# Patient Record
Sex: Male | Born: 1995 | Race: White | Hispanic: No | Marital: Single | Smoking: Never smoker
Health system: Southern US, Community
[De-identification: ages and names within clinical notes are randomized; demographics above are authoritative.]

## PROBLEM LIST (undated history)

## (undated) DIAGNOSIS — Q873 Congenital malformation syndromes involving early overgrowth: Secondary | ICD-10-CM

## (undated) DIAGNOSIS — K449 Diaphragmatic hernia without obstruction or gangrene: Secondary | ICD-10-CM

## (undated) DIAGNOSIS — J45909 Unspecified asthma, uncomplicated: Secondary | ICD-10-CM

## (undated) HISTORY — DX: Diaphragmatic hernia without obstruction or gangrene: K44.9

## (undated) HISTORY — DX: Congenital malformation syndromes involving early overgrowth: Q87.3

## (undated) HISTORY — PX: GASTROSTOMY W/ FEEDING TUBE: SUR642

## (undated) HISTORY — PX: TONSILLECTOMY: SUR1361

## (undated) HISTORY — PX: ORCHIOPEXY: SHX479

## (undated) HISTORY — PX: TYMPANOSTOMY TUBE PLACEMENT: SHX32

## (undated) HISTORY — DX: Unspecified asthma, uncomplicated: J45.909

---
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
## 2017-11-29 DIAGNOSIS — K529 Noninfective gastroenteritis and colitis, unspecified: Secondary | ICD-10-CM | POA: Diagnosis not present

## 2018-01-15 DIAGNOSIS — Z0000 Encounter for general adult medical examination without abnormal findings: Secondary | ICD-10-CM | POA: Diagnosis not present

## 2018-01-15 DIAGNOSIS — K5904 Chronic idiopathic constipation: Secondary | ICD-10-CM | POA: Diagnosis not present

## 2018-01-15 DIAGNOSIS — K219 Gastro-esophageal reflux disease without esophagitis: Secondary | ICD-10-CM | POA: Diagnosis not present

## 2018-01-27 DIAGNOSIS — S93402A Sprain of unspecified ligament of left ankle, initial encounter: Secondary | ICD-10-CM | POA: Diagnosis not present

## 2018-01-29 DIAGNOSIS — S8265XA Nondisplaced fracture of lateral malleolus of left fibula, initial encounter for closed fracture: Secondary | ICD-10-CM | POA: Diagnosis not present

## 2018-02-05 DIAGNOSIS — S8265XD Nondisplaced fracture of lateral malleolus of left fibula, subsequent encounter for closed fracture with routine healing: Secondary | ICD-10-CM | POA: Diagnosis not present

## 2018-02-14 DIAGNOSIS — S8265XD Nondisplaced fracture of lateral malleolus of left fibula, subsequent encounter for closed fracture with routine healing: Secondary | ICD-10-CM | POA: Diagnosis not present

## 2018-02-26 DIAGNOSIS — J209 Acute bronchitis, unspecified: Secondary | ICD-10-CM | POA: Diagnosis not present

## 2018-02-26 DIAGNOSIS — J069 Acute upper respiratory infection, unspecified: Secondary | ICD-10-CM | POA: Diagnosis not present

## 2018-03-12 DIAGNOSIS — S8265XD Nondisplaced fracture of lateral malleolus of left fibula, subsequent encounter for closed fracture with routine healing: Secondary | ICD-10-CM | POA: Diagnosis not present

## 2018-03-29 DIAGNOSIS — M25572 Pain in left ankle and joints of left foot: Secondary | ICD-10-CM | POA: Diagnosis not present

## 2018-03-29 DIAGNOSIS — M6281 Muscle weakness (generalized): Secondary | ICD-10-CM | POA: Diagnosis not present

## 2018-03-29 DIAGNOSIS — R2689 Other abnormalities of gait and mobility: Secondary | ICD-10-CM | POA: Diagnosis not present

## 2018-04-06 DIAGNOSIS — M6281 Muscle weakness (generalized): Secondary | ICD-10-CM | POA: Diagnosis not present

## 2018-04-06 DIAGNOSIS — M25572 Pain in left ankle and joints of left foot: Secondary | ICD-10-CM | POA: Diagnosis not present

## 2018-04-06 DIAGNOSIS — R2689 Other abnormalities of gait and mobility: Secondary | ICD-10-CM | POA: Diagnosis not present

## 2018-04-11 DIAGNOSIS — S8265XD Nondisplaced fracture of lateral malleolus of left fibula, subsequent encounter for closed fracture with routine healing: Secondary | ICD-10-CM | POA: Diagnosis not present

## 2018-04-12 DIAGNOSIS — M6281 Muscle weakness (generalized): Secondary | ICD-10-CM | POA: Diagnosis not present

## 2018-04-12 DIAGNOSIS — M25572 Pain in left ankle and joints of left foot: Secondary | ICD-10-CM | POA: Diagnosis not present

## 2018-04-12 DIAGNOSIS — R2689 Other abnormalities of gait and mobility: Secondary | ICD-10-CM | POA: Diagnosis not present

## 2018-04-16 DIAGNOSIS — M6281 Muscle weakness (generalized): Secondary | ICD-10-CM | POA: Diagnosis not present

## 2018-04-16 DIAGNOSIS — M25572 Pain in left ankle and joints of left foot: Secondary | ICD-10-CM | POA: Diagnosis not present

## 2018-04-16 DIAGNOSIS — R2689 Other abnormalities of gait and mobility: Secondary | ICD-10-CM | POA: Diagnosis not present

## 2018-04-19 DIAGNOSIS — R2689 Other abnormalities of gait and mobility: Secondary | ICD-10-CM | POA: Diagnosis not present

## 2018-04-19 DIAGNOSIS — M6281 Muscle weakness (generalized): Secondary | ICD-10-CM | POA: Diagnosis not present

## 2018-04-19 DIAGNOSIS — M25572 Pain in left ankle and joints of left foot: Secondary | ICD-10-CM | POA: Diagnosis not present

## 2018-04-23 DIAGNOSIS — R2689 Other abnormalities of gait and mobility: Secondary | ICD-10-CM | POA: Diagnosis not present

## 2018-04-23 DIAGNOSIS — M6281 Muscle weakness (generalized): Secondary | ICD-10-CM | POA: Diagnosis not present

## 2018-04-23 DIAGNOSIS — M25572 Pain in left ankle and joints of left foot: Secondary | ICD-10-CM | POA: Diagnosis not present

## 2018-04-26 DIAGNOSIS — M25572 Pain in left ankle and joints of left foot: Secondary | ICD-10-CM | POA: Diagnosis not present

## 2018-04-26 DIAGNOSIS — M6281 Muscle weakness (generalized): Secondary | ICD-10-CM | POA: Diagnosis not present

## 2018-04-26 DIAGNOSIS — R2689 Other abnormalities of gait and mobility: Secondary | ICD-10-CM | POA: Diagnosis not present

## 2018-05-01 DIAGNOSIS — R2689 Other abnormalities of gait and mobility: Secondary | ICD-10-CM | POA: Diagnosis not present

## 2018-05-01 DIAGNOSIS — M25572 Pain in left ankle and joints of left foot: Secondary | ICD-10-CM | POA: Diagnosis not present

## 2018-05-01 DIAGNOSIS — M6281 Muscle weakness (generalized): Secondary | ICD-10-CM | POA: Diagnosis not present

## 2018-05-03 DIAGNOSIS — M6281 Muscle weakness (generalized): Secondary | ICD-10-CM | POA: Diagnosis not present

## 2018-05-03 DIAGNOSIS — M25572 Pain in left ankle and joints of left foot: Secondary | ICD-10-CM | POA: Diagnosis not present

## 2018-05-03 DIAGNOSIS — R2689 Other abnormalities of gait and mobility: Secondary | ICD-10-CM | POA: Diagnosis not present

## 2018-05-08 DIAGNOSIS — M6281 Muscle weakness (generalized): Secondary | ICD-10-CM | POA: Diagnosis not present

## 2018-05-08 DIAGNOSIS — R2689 Other abnormalities of gait and mobility: Secondary | ICD-10-CM | POA: Diagnosis not present

## 2018-05-08 DIAGNOSIS — M25572 Pain in left ankle and joints of left foot: Secondary | ICD-10-CM | POA: Diagnosis not present

## 2018-05-10 DIAGNOSIS — M6281 Muscle weakness (generalized): Secondary | ICD-10-CM | POA: Diagnosis not present

## 2018-05-10 DIAGNOSIS — M25572 Pain in left ankle and joints of left foot: Secondary | ICD-10-CM | POA: Diagnosis not present

## 2018-05-10 DIAGNOSIS — R2689 Other abnormalities of gait and mobility: Secondary | ICD-10-CM | POA: Diagnosis not present

## 2018-05-16 DIAGNOSIS — R2689 Other abnormalities of gait and mobility: Secondary | ICD-10-CM | POA: Diagnosis not present

## 2018-05-16 DIAGNOSIS — M6281 Muscle weakness (generalized): Secondary | ICD-10-CM | POA: Diagnosis not present

## 2018-05-16 DIAGNOSIS — M25572 Pain in left ankle and joints of left foot: Secondary | ICD-10-CM | POA: Diagnosis not present

## 2018-06-16 DIAGNOSIS — L03039 Cellulitis of unspecified toe: Secondary | ICD-10-CM | POA: Diagnosis not present

## 2018-06-18 DIAGNOSIS — S83411A Sprain of medial collateral ligament of right knee, initial encounter: Secondary | ICD-10-CM | POA: Diagnosis not present

## 2018-07-05 DIAGNOSIS — K219 Gastro-esophageal reflux disease without esophagitis: Secondary | ICD-10-CM | POA: Diagnosis not present

## 2018-07-05 DIAGNOSIS — K5909 Other constipation: Secondary | ICD-10-CM | POA: Diagnosis not present

## 2018-07-18 DIAGNOSIS — S83419A Sprain of medial collateral ligament of unspecified knee, initial encounter: Secondary | ICD-10-CM | POA: Diagnosis not present

## 2018-08-01 DIAGNOSIS — L600 Ingrowing nail: Secondary | ICD-10-CM | POA: Diagnosis not present

## 2018-08-01 DIAGNOSIS — M79675 Pain in left toe(s): Secondary | ICD-10-CM | POA: Diagnosis not present

## 2018-08-15 DIAGNOSIS — L600 Ingrowing nail: Secondary | ICD-10-CM | POA: Diagnosis not present

## 2018-08-15 DIAGNOSIS — M79675 Pain in left toe(s): Secondary | ICD-10-CM | POA: Diagnosis not present

## 2018-08-15 DIAGNOSIS — S83411D Sprain of medial collateral ligament of right knee, subsequent encounter: Secondary | ICD-10-CM | POA: Diagnosis not present

## 2018-08-29 DIAGNOSIS — J069 Acute upper respiratory infection, unspecified: Secondary | ICD-10-CM | POA: Diagnosis not present

## 2018-08-30 DIAGNOSIS — J0100 Acute maxillary sinusitis, unspecified: Secondary | ICD-10-CM | POA: Diagnosis not present

## 2018-09-10 DIAGNOSIS — M79675 Pain in left toe(s): Secondary | ICD-10-CM | POA: Diagnosis not present

## 2018-09-10 DIAGNOSIS — L600 Ingrowing nail: Secondary | ICD-10-CM | POA: Diagnosis not present

## 2019-01-11 DIAGNOSIS — Z23 Encounter for immunization: Secondary | ICD-10-CM | POA: Diagnosis not present

## 2019-01-11 DIAGNOSIS — S61411A Laceration without foreign body of right hand, initial encounter: Secondary | ICD-10-CM | POA: Diagnosis not present
